# Patient Record
Sex: Female | Born: 1995 | Hispanic: Yes | Marital: Single | Smoking: Never smoker
Health system: Southern US, Community
[De-identification: ages and names within clinical notes are randomized; demographics above are authoritative.]

## PROBLEM LIST (undated history)

## (undated) DIAGNOSIS — G43109 Migraine with aura, not intractable, without status migrainosus: Secondary | ICD-10-CM

## (undated) DIAGNOSIS — R87629 Unspecified abnormal cytological findings in specimens from vagina: Secondary | ICD-10-CM

## (undated) DIAGNOSIS — B009 Herpesviral infection, unspecified: Secondary | ICD-10-CM

## (undated) HISTORY — DX: Unspecified abnormal cytological findings in specimens from vagina: R87.629

## (undated) HISTORY — DX: Herpesviral infection, unspecified: B00.9

## (undated) HISTORY — PX: OTHER SURGICAL HISTORY: SHX169

## (undated) HISTORY — DX: Migraine with aura, not intractable, without status migrainosus: G43.109

---
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
## 2015-10-28 ENCOUNTER — Emergency Department
Admission: EM | Admit: 2015-10-28 | Discharge: 2015-10-29 | Disposition: A | Discharge status: Home / Self Care | Payer: Self-pay | Attending: Emergency Medicine | Admitting: Emergency Medicine

## 2015-10-28 DIAGNOSIS — F10921 Alcohol use, unspecified with intoxication delirium: Secondary | ICD-10-CM | POA: Insufficient documentation

## 2015-10-28 MED ORDER — SODIUM CHLORIDE 0.9 % IV BOLUS (SEPSIS)
1000.0000 mL | Freq: Once | INTRAVENOUS | Status: AC
  Administered 2015-10-28: 1000 mL via INTRAVENOUS

## 2015-10-28 MED ORDER — LORAZEPAM 2 MG/ML IJ SOLN
2.0000 mg | Freq: Once | INTRAMUSCULAR | Status: AC
  Administered 2015-10-28: 2 mg via INTRAMUSCULAR

## 2015-10-28 MED ORDER — HALOPERIDOL LACTATE 5 MG/ML IJ SOLN
5.0000 mg | Freq: Once | INTRAMUSCULAR | Status: AC
  Administered 2015-10-28: 5 mg via INTRAMUSCULAR

## 2015-10-28 NOTE — ED Notes (Signed)
Patient does not respond to questions at this time.  Will await for patient to become more arousable..Marland Kitchen

## 2015-10-28 NOTE — ED Provider Notes (Signed)
[facility] Emergency Department Provider Note  ____________________________________________  Time seen: Approximately 11:02 PM  I have reviewed the triage vital signs and the nursing notes.   HISTORY  Chief Complaint Altered Mental Status  EM caveat: The patient combative agitated, will not answer questions appropriately  HPI Felicia Stanley is a [age] y.o. unknown approximately 4920s-year-old appearing female.  Presents for evaluation from a local "carnival". Apparently thought to be altered, wondering, and felt to be intoxicated and disorderly.  Patient name unknown. No identifying information.    No past medical history on file.  There are no active problems to display for this patient.   No past surgical history on file.  No current outpatient prescriptions on file.  Allergies Review of patient's allergies indicates not on file.  No family history on file.  Social History Social History  Substance Use Topics  . Smoking status: Not on file  . Smokeless tobacco: Not on file  . Alcohol Use: Not on file    Review of Systems EM caveat ____________________________________________   PHYSICAL EXAM:  VITAL SIGNS: ED Triage Vitals  Enc Vitals Group     BP --      Pulse --      Resp --      Temp --      Temp src --      SpO2 --      Weight --      Height --      Head Cir --      Peak Flow --      Pain Score --      Pain Loc --      Pain Edu? --      Excl. in GC? --    Constitutional: Alert But screaming, thrashing and very agitated. She is screaming at times, very disorderly. She is not violent. Eyes: Conjunctivae are slightly injected. PERRL. EOMI. Head: Atraumatic. Nose: No congestion/rhinnorhea. Mouth/Throat: Mucous membranes are moist.  Oropharynx non-erythematous. Neck: No stridor.   Cardiovascular: Normal rate, regular rhythm. Grossly normal heart sounds.  Good peripheral circulation. Respiratory: Slightly increased  respiratory effort, agitated and yelling at times.  No retractions. Lungs CTAB. Gastrointestinal: Soft and nontender. No distention. Musculoskeletal: No lower extremity tenderness nor edema.  No joint effusions. Neurologic:  Agitated. Slightly slurred speech. She does move all extremities well. There is no obvious focal deficit. The patient does have lateral gaze nystagmus. Skin:  Skin is warm, dry and intact. No rash noted. Psychiatric: Mood and affect agitated.  ____________________________________________   LABS (all labs ordered are listed, but only abnormal results are displayed)  Labs Reviewed  CBC  BASIC METABOLIC PANEL  HCG, QUANTITATIVE, PREGNANCY  ETHANOL  URINE DRUG SCREEN, QUALITATIVE ([facility] ONLY)  URINALYSIS COMPLETEWITH MICROSCOPIC ([facility] ONLY)  CBG MONITORING, ED   ____________________________________________  EKG   ____________________________________________  RADIOLOGY CT head pending at time of sign out  ____________________________________________   PROCEDURES  Procedure(s) performed: None  Critical Care performed: No  ____________________________________________   INITIAL IMPRESSION / ASSESSMENT AND PLAN / ED COURSE  Pertinent labs & imaging results that were available during my care of the patient were reviewed by me and considered in my medical decision making (see chart for details).  Patient presents agitated, combative. Some slight slurring her speech and some mild lateral ataxia. Suspect primarily topics, no evidence of trauma.   The patient's labs reviewed, alcohol level 359. This likely explains the patient's hesitation. She had to be administered IM  Haldol and Ativan on arrival. She is now resting comfortably in no distress with stable hemodynamics. Patient is protecting her airway well. There is no report of suicidal ideation or attempt. Plan to observe the patient closely in the ER, continue to monitor on telemetry and pulse oximetry.  Reassess and reevaluate.  Ongoing care and disposition assigned Dr. Dolores Frame. Follow-up CT of the head, hcg, reexamine reevaluate for improvement in mental status. ____________________________________________   FINAL CLINICAL IMPRESSION(S) / ED DIAGNOSES  Final diagnoses:  Alcohol intoxication, with delirium (HCC)      Sharyn Creamer, MD 10/29/15 [phone]

## 2015-10-28 NOTE — ED Notes (Signed)
Dr. Fanny BienQuale notified of critical lab value - ETOH  359.  Acknowledged, no new orders.

## 2015-10-28 NOTE — ED Notes (Signed)
Patient brought to the ED via EMS from local carnival.  Per EMS patient was altered, possibly intoxicated and being disorderly to others on site.  On arrival to the ED patient was yelling and moaning and throwing herself side to side on EMS stretcher and then on ED stretcher.  Dr. Fanny BienQuale to bedside.

## 2015-10-29 ENCOUNTER — Emergency Department: Payer: Self-pay

## 2015-10-29 LAB — CBC
HCT: 39.6 % (ref 35.0–47.0)
Hemoglobin: 13.6 g/dL (ref 12.0–16.0)
MCH: 31.5 pg (ref 26.0–34.0)
MCHC: 34.3 g/dL (ref 32.0–36.0)
MCV: 91.8 fL (ref 80.0–100.0)
Platelets: 203 10*3/uL (ref 150–440)
RBC: 4.31 MIL/uL (ref 3.80–5.20)
RDW: 12 % (ref 11.5–14.5)
WBC: 5.6 10*3/uL (ref 3.6–11.0)

## 2015-10-29 LAB — BASIC METABOLIC PANEL
Anion gap: 7 (ref 5–15)
BUN: 7 mg/dL (ref 6–20)
CO2: 26 mmol/L (ref 22–32)
Calcium: 9.3 mg/dL (ref 8.9–10.3)
Chloride: 109 mmol/L (ref 101–111)
Creatinine, Ser: 0.75 mg/dL (ref 0.44–1.00)
Glucose, Bld: 101 mg/dL — ABNORMAL HIGH (ref 65–99)
Potassium: 3.4 mmol/L — ABNORMAL LOW (ref 3.5–5.1)
Sodium: 142 mmol/L (ref 135–145)

## 2015-10-29 LAB — URINALYSIS COMPLETE WITH MICROSCOPIC (ARMC ONLY)
Bacteria, UA: NONE SEEN
Bilirubin Urine: NEGATIVE
Glucose, UA: NEGATIVE mg/dL
Ketones, ur: NEGATIVE mg/dL
Leukocytes, UA: NEGATIVE
Nitrite: NEGATIVE
Protein, ur: NEGATIVE mg/dL
Specific Gravity, Urine: 1.01 (ref 1.005–1.030)
pH: 6 (ref 5.0–8.0)

## 2015-10-29 LAB — URINE DRUG SCREEN, QUALITATIVE (ARMC ONLY)
Amphetamines, Ur Screen: NOT DETECTED
Barbiturates, Ur Screen: NOT DETECTED
Benzodiazepine, Ur Scrn: NOT DETECTED
Cannabinoid 50 Ng, Ur ~~LOC~~: POSITIVE — AB
Cocaine Metabolite,Ur ~~LOC~~: NOT DETECTED
MDMA (Ecstasy)Ur Screen: NOT DETECTED
Methadone Scn, Ur: NOT DETECTED
Opiate, Ur Screen: NOT DETECTED
Phencyclidine (PCP) Ur S: NOT DETECTED
Tricyclic, Ur Screen: NOT DETECTED

## 2015-10-29 LAB — ETHANOL
Alcohol, Ethyl (B): 359 mg/dL (ref <5)
Alcohol, Ethyl (B): 147 mg/dL — ABNORMAL HIGH (ref <5)

## 2015-10-29 LAB — HCG, QUANTITATIVE, PREGNANCY: hCG, Beta Chain, Quant, S: <1 m[IU]/mL (ref <5)

## 2015-10-29 NOTE — ED Notes (Signed)
Patient was aroused by verbal stimuli, but would fall back asleep easily.  After several attempts patient Stated her name was Felicia Stanley, DOB 23-Oct-1995.

## 2015-10-29 NOTE — ED Notes (Signed)
Patient resting quietly at this time.  No acute distress noted. 

## 2015-10-29 NOTE — ED Notes (Signed)
Patient transported to CT 

## 2015-10-29 NOTE — ED Provider Notes (Signed)
-----------------------------------------   6:52 AM on 10/29/2015 -----------------------------------------   Blood pressure 97/48, pulse 74, resp. rate 17, height 5\' 6"  (1.676 m), weight 150 lb (68.04 kg), SpO2 97 %.  The patient had no acute events since last update.  CT head without contrast interpreted per Dr. Clovis RileyMitchell demonstrated a normal brain. Beta hCG negative. Patient has periods of arousability but falls promptly back to sleep. Second liter IV fluids infusing. She will require reexamination regarding SI/HI/AH/VH once she is awake and sober. If she denies the above, then I anticipate she will be able to be discharged once she is ambulatory with steady gait. Care transferred to Dr. Ladona Ridgelaylor.   Irean HongJade J Sung, MD 10/29/15 [phone]

## 2015-10-29 NOTE — ED Provider Notes (Signed)
Progress note  4./16/17 12:00 PM Patient's alcohol is less than 150, she is alert, oriented, and she is not suicidal or homicidal. Patient was not trying to hurt herself or anyone else, she strictly just gotten intoxicated. Patient was told to return immediately if condition worsens, to not drink alcohol, and follow-up with her PMD as needed.   Leona CarryLinda M Taylor, MD 10/29/15 1200

## 2015-10-29 NOTE — ED Notes (Signed)
Patient bed linens changed x2, patient had wet the bed

## 2015-10-29 NOTE — ED Notes (Signed)
Returned from CT.

## 2016-07-23 DIAGNOSIS — E663 Overweight: Secondary | ICD-10-CM | POA: Insufficient documentation

## 2016-12-02 DIAGNOSIS — B009 Herpesviral infection, unspecified: Secondary | ICD-10-CM | POA: Insufficient documentation

## 2018-03-09 LAB — HM PAP SMEAR: HM Pap smear: NEGATIVE

## 2018-03-09 LAB — HM HIV SCREENING LAB: HM HIV Screening: NEGATIVE

## 2019-04-06 ENCOUNTER — Telehealth: Payer: Self-pay

## 2019-04-13 NOTE — Telephone Encounter (Signed)
TC with patient.  Discussed need for pap f/u. Appt scheduled for 04/19/19. Patient also requests IUD ck and std screen. Aileen Fass, RN

## 2019-04-15 DIAGNOSIS — B009 Herpesviral infection, unspecified: Secondary | ICD-10-CM

## 2019-04-15 DIAGNOSIS — E663 Overweight: Secondary | ICD-10-CM

## 2019-04-19 ENCOUNTER — Ambulatory Visit: Payer: Self-pay

## 2019-04-23 ENCOUNTER — Ambulatory Visit (LOCAL_COMMUNITY_HEALTH_CENTER): Payer: Self-pay | Admitting: Advanced Practice Midwife

## 2019-04-23 ENCOUNTER — Encounter: Payer: Self-pay | Admitting: Advanced Practice Midwife

## 2019-04-23 ENCOUNTER — Other Ambulatory Visit: Payer: Self-pay

## 2019-04-23 ENCOUNTER — Ambulatory Visit: Payer: Self-pay

## 2019-04-23 VITALS — BP 99/61 | Ht 61.0 in | Wt 133.2 lb

## 2019-04-23 DIAGNOSIS — Z3009 Encounter for other general counseling and advice on contraception: Secondary | ICD-10-CM

## 2019-04-23 DIAGNOSIS — Z8742 Personal history of other diseases of the female genital tract: Secondary | ICD-10-CM

## 2019-04-23 DIAGNOSIS — Z3049 Encounter for surveillance of other contraceptives: Secondary | ICD-10-CM

## 2019-04-23 DIAGNOSIS — N760 Acute vaginitis: Secondary | ICD-10-CM

## 2019-04-23 DIAGNOSIS — B9689 Other specified bacterial agents as the cause of diseases classified elsewhere: Secondary | ICD-10-CM

## 2019-04-23 LAB — WET PREP FOR TRICH, YEAST, CLUE
Trichomonas Exam: NEGATIVE
Yeast Exam: NEGATIVE

## 2019-04-23 MED ORDER — METRONIDAZOLE 500 MG PO TABS: 500.0000 mg | ORAL_TABLET | Freq: Two times a day (BID) | ORAL | 0 refills | Status: AC

## 2019-04-23 NOTE — Progress Notes (Signed)
   Felicia Stanley problem visit  [place] Department  Subjective:  Felicia Stanley is a 23 y.o.SHF G2P1 nonsmoker being seen today for STD exam, pap, IUD string check  Chief Complaint  Patient presents with  . Contraception    IUD check  . Exposure to STD  . Abnormal Pap Smear    follow up for 2018 abnormal pap    HPI  LMP 03/24/19, Last sex 04/17/19 without condom.  Paraguard inserted 08/13/2016.  Pap 07/23/16 LGSIL, 03/09/2018 neg.  Denies symptoms Does the patient have a current or past history of drug use? No   No components found for: HCV]   Health Maintenance Due  Topic Date Due  . TETANUS/TDAP  03/01/2013  . INFLUENZA VACCINE  02/13/2019  . PAP SMEAR-Modifier  03/10/2019    ROS  The following portions of the patient's history were reviewed and updated as appropriate: allergies, current medications, past family history, past medical history, past social history, past surgical history and problem list. Problem list updated.   See flowsheet for other program required questions.  Objective:   Vitals:   04/23/19 0849  BP: 99/61  Weight: 133 lb 3.2 oz (60.4 kg)  Height: 5\' 1"  (1.549 m)    Physical Exam Constitutional:      Appearance: Normal appearance. She is normal weight.  HENT:     Head: Normocephalic and atraumatic.  Eyes:     Conjunctiva/sclera: Conjunctivae normal.  Pulmonary:     Effort: Pulmonary effort is normal.     Breath sounds: Normal breath sounds.  Genitourinary:    General: Normal vulva.     Exam position: Lithotomy position.     Pubic Area: No rash or pubic lice.      Labia:        Right: Lesion (anterior aspect of right labia minora is a firm cystic 5x4 mm mass which is sl brown nontender onset 2 mo) present.      Vagina: Vaginal discharge (small amt white malodorous d/c, ph>4.5) present.     Cervix: No friability, lesion or erythema.     Uterus: Normal.      Adnexa: Right adnexa normal and left adnexa normal.   Rectum: Normal.    Skin:    General: Skin is warm and dry.  Neurological:     Mental Status: She is alert.       Assessment and Plan:  Felicia Stanley is a 23 y.o. female presenting to the [facility] Department for a Women's Health problem visit  1. Family planning Treat wet mount for BV Immunization nurse consult Cystic mass right labia minora 4x5 mm--to try warm bath/compresses BID x 10-20.  F/U in 4 wks for reevaluation--please schedule - WET PREP FOR TRICH, YEAST, CLUE - Chlamydia/Gonorrhea [place] - IGP, rfx Aptima HPV ASCU  2. Encounter for surveillance of other contraceptive Here for Paraguard IUD string check  3. Hx of abnormal cervical Pap smear 07/23/16 LGSIL LGSIL 07/23/16, 03/09/18 neg.  Pap done today     Return if symptoms worsen or fail to improve.  No future appointments.  Felicia Stanley, CNM

## 2019-04-23 NOTE — Progress Notes (Signed)
Patient here for follow-up Pap test. Abnormal LGSIL in 2018, and NIL in 2019. Also wants IUD check (paraguard), and STD screening.Jenetta Downer, RN

## 2019-04-23 NOTE — Progress Notes (Signed)
Wet mount reviewed, patient treated for BV per SO..Katie Brewer-Jensen, RN  

## 2019-04-29 LAB — IGP, RFX APTIMA HPV ASCU: PAP Smear Comment: 0

## 2019-06-01 ENCOUNTER — Other Ambulatory Visit: Payer: Self-pay

## 2019-06-01 ENCOUNTER — Ambulatory Visit: Payer: Self-pay | Admitting: Family Medicine

## 2019-06-01 ENCOUNTER — Encounter: Payer: Self-pay | Admitting: Family Medicine

## 2019-06-01 DIAGNOSIS — Z202 Contact with and (suspected) exposure to infections with a predominantly sexual mode of transmission: Secondary | ICD-10-CM

## 2019-06-01 DIAGNOSIS — Z113 Encounter for screening for infections with a predominantly sexual mode of transmission: Secondary | ICD-10-CM

## 2019-06-01 MED ORDER — AZITHROMYCIN 500 MG PO TABS
1000.0000 mg | ORAL_TABLET | Freq: Once | ORAL | Status: AC
  Administered 2019-06-01: 1000 mg via ORAL

## 2019-06-01 NOTE — Progress Notes (Signed)
    STI clinic/screening visit  Subjective:  Felicia Stanley is a 23 y.o. female being seen today for an STI screening visit. The patient reports they do not have symptoms.  Patient has the following medical conditions:   Patient Active Problem List   Diagnosis Date Noted  . Hx of abnormal cervical Pap smear 07/23/16 LGSIL 04/23/2019  . 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 Overweight 07/23/2016     No chief complaint on file.   HPI  Patient reports her partner informed her that she needed treatment for chlamydia.  She states that she was seen @[facility]  3-4 wks ago with negative testing.  See flowsheet for further details and programmatic requirements.    The following portions of the patient's history were reviewed and updated as appropriate: allergies, current medications, past medical history, past social history, past surgical history and problem list.  Objective:  There were no vitals filed for this visit.  Physical Exam Constitutional:      Appearance: Normal appearance.  HENT:     Mouth/Throat:     Pharynx: Oropharynx is clear. No oropharyngeal exudate or posterior oropharyngeal erythema.  Neck:     Musculoskeletal: Neck supple. No muscular tenderness.  Genitourinary:    Comments: Client declines pelvic.  Self-collect GC/chlamydia Lymphadenopathy:     Cervical: No cervical adenopathy.  Skin:    General: Skin is warm and dry.     Findings: No lesion or rash.  Neurological:     Mental Status: She is alert.    Assessment and Plan:  Felicia Stanley is a 23 y.o. female presenting to the [facility] Department for STI screening  1. Screening examination for venereal disease  - Chlamydia/Gonorrhea [facility]  2. Chlamydia contact - azithromycin (ZITHROMAX) tablet 1,000 mg once. -Co to use condoms always.     No follow-ups on file.  No future appointments.  Hassell Done, FNP

## 2019-06-01 NOTE — Progress Notes (Signed)
Patient retreated for Chlamydia per provider orders. Hal Morales, RN

## 2019-10-18 ENCOUNTER — Ambulatory Visit: Payer: Self-pay | Attending: Internal Medicine

## 2019-10-18 ENCOUNTER — Ambulatory Visit: Payer: Self-pay

## 2019-10-18 DIAGNOSIS — Z23 Encounter for immunization: Secondary | ICD-10-CM

## 2019-10-18 NOTE — Progress Notes (Signed)
   Covid-19 Vaccination Clinic  Name:  Felicia Stanley    MRN: [id number] DOB: Jun 05, 1996  10/18/2019  Felicia Stanley was observed post Covid-19 immunization for 15 minutes without incident. She was provided with Vaccine Information Sheet and instruction to access the V-Safe system.   Felicia Stanley was instructed to call 911 with any severe reactions post vaccine: Marland Kitchen Difficulty breathing  . Swelling of face and throat  . A fast heartbeat  . A bad rash all over body  . Dizziness and weakness   Immunizations Administered    Name Date Dose VIS Date Route   Pfizer COVID-19 Vaccine 10/18/2019  1:36 PM 0.3 mL 06/25/2019 Intramuscular   Manufacturer: [employer], [employer]   Lot: [id number]   NDC: [address]-1

## 2019-11-08 ENCOUNTER — Ambulatory Visit: Payer: Self-pay | Attending: Internal Medicine

## 2019-11-08 ENCOUNTER — Ambulatory Visit: Payer: Self-pay

## 2019-11-08 DIAGNOSIS — Z23 Encounter for immunization: Secondary | ICD-10-CM

## 2019-11-08 NOTE — Progress Notes (Signed)
   Covid-19 Vaccination Clinic  Name:  Lujain Kraszewski    MRN: [id number] DOB: [birth year]  11/08/2019  Ms. Thiemann was observed post Covid-19 immunization for 15 minutes without incident. She was provided with Vaccine Information Sheet and instruction to access the V-Safe system.   Ms. Westergard was instructed to call 911 with any severe reactions post vaccine: Marland Kitchen Difficulty breathing  . Swelling of face and throat  . A fast heartbeat  . A bad rash all over body  . Dizziness and weakness   Immunizations Administered    Name Date Dose VIS Date Route   Pfizer COVID-19 Vaccine 11/08/2019  2:26 PM 0.3 mL 09/08/2018 Intramuscular   Manufacturer: [employer], [employer]   Lot: [id number]   NDC: [address]-7

## 2020-10-26 ENCOUNTER — Telehealth: Payer: Self-pay | Admitting: Family Medicine

## 2020-10-26 NOTE — Telephone Encounter (Signed)
Patient called to schedule an appointment for an IUD removal

## 2020-10-30 NOTE — Telephone Encounter (Signed)
Call to client who desires physical and removal of Paragard. Reports last intercourse was 3 weeks ago. Counseled to remain abstinent until after appt on 11/02/2020 and client agrees to do so. Jossie Ng, RN

## 2020-11-02 ENCOUNTER — Ambulatory Visit (LOCAL_COMMUNITY_HEALTH_CENTER): Payer: Self-pay | Admitting: Physician Assistant

## 2020-11-02 ENCOUNTER — Other Ambulatory Visit: Payer: Self-pay

## 2020-11-02 ENCOUNTER — Encounter: Payer: Self-pay | Admitting: Physician Assistant

## 2020-11-02 VITALS — BP 92/59 | Ht 62.0 in | Wt 132.8 lb

## 2020-11-02 DIAGNOSIS — Z3009 Encounter for other general counseling and advice on contraception: Secondary | ICD-10-CM

## 2020-11-02 DIAGNOSIS — Z01419 Encounter for gynecological examination (general) (routine) without abnormal findings: Secondary | ICD-10-CM

## 2020-11-02 NOTE — Progress Notes (Signed)
Patient here for PE and IUD removal. States she has copper IUD and has had it for about 8 years. Patient states she was told she had a labial cyst a couple of years ago and was told to watch it for changes. Would like to have that checked today as well. Declines BCM today to "let my body rest". Cpndom consent signed.Burt Knack, RN

## 2020-11-02 NOTE — Progress Notes (Signed)
[facility] DEPARTMENT [facility] [address] Main Number: [phone]    Family Planning Visit- Initial Visit  Subjective:  Felicia Stanley is a 25 y.o.  G1P0101   being seen today for an initial annual visit and to discuss contraceptive options.  The patient is currently using IUD Paragard for pregnancy prevention. Patient reports she does not want a pregnancy in the next year.  Patient has the following medical conditions has HSV-2 infection; Overweight; and Hx of abnormal cervical Pap smear 07/23/16 LGSIL on their problem list.  Chief Complaint  Patient presents with  . Annual Exam     Body mass index is 24.29 kg/m. - Patient is eligible for diabetes screening based on BMI and age >41?  no HA1C ordered? not applicable  Patient reports 1  partner/s in last year. Desires STI screening?  No - declines  Has patient been screened once for HCV in the past?  No  No results found for: HCVAB  Does the patient have current drug use (including MJ), have a partner with drug use, and/or has been incarcerated since last result? No  If yes-- Screen for HCV through [facility]   Does the patient meet criteria for HBV testing? No  Criteria:  -Household, sexual or needle sharing contact with HBV -History of drug use -HIV positive -Those with known Hep C   Health Maintenance Due  Topic Date Due  . Hepatitis C Screening  Never done  . HPV VACCINES (1 - 2-dose series) Never done  . TETANUS/TDAP  12/06/2015  . PAP SMEAR-Modifier  04/22/2020  . COVID-19 Vaccine (3 - Booster for Pfizer series) 05/09/2020    Review of Systems  All other systems reviewed and are negative.   The following portions of the patient's history were reviewed and updated as appropriate: allergies, current medications, past family history, past medical history, past social history, past surgical history and problem list. Problem list updated.   See flowsheet for other  program required questions.  Objective:   Vitals:   11/02/20 0940  BP: (!) 92/59  Weight: 132 lb 12.8 oz (60.2 kg)  Height: 5\' 2"  (1.575 m)    Physical Exam Constitutional:      Appearance: Normal appearance.  HENT:     Head: Normocephalic and atraumatic.  Pulmonary:     Effort: Pulmonary effort is normal.  Abdominal:     Palpations: Abdomen is soft.     Hernia: There is no hernia in the left inguinal area or right inguinal area.  Genitourinary:    General: Normal vulva.     Exam position: Lithotomy position.     Pubic Area: No rash.      Labia:        Right: Lesion present. No tenderness.        Left: Lesion present. No tenderness.      Urethra: No urethral swelling or urethral lesion.     Vagina: No signs of injury. No vaginal discharge, tenderness or bleeding.     Cervix: No cervical motion tenderness, discharge or lesion.     Uterus: Not tender.      Adnexa:        Right: No tenderness.         Left: No tenderness.       Rectum: No external hemorrhoid.       Comments: IUD strings visible from os Musculoskeletal:        General: Normal range of motion.  Lymphadenopathy:     Lower Body: No right inguinal adenopathy. No left inguinal adenopathy.  Skin:    General: Skin is warm and dry.     Comments: Multiple tattoos noted.  Neurological:     General: No focal deficit present.     Mental Status: She is alert.  Psychiatric:        Mood and Affect: Mood normal.        Behavior: Behavior normal.       Assessment and Plan:  SABRYNA LAHM is a 25 y.o. female presenting to the [facility] Department for an initial annual wellness/contraceptive visit  Contraception counseling: Reviewed all forms of birth control options in the tiered based approach. available including abstinence; over the counter/barrier methods; hormonal contraceptive medication including pill, patch, ring, injection,contraceptive implant, ECP; hormonal and nonhormonal IUDs;  permanent sterilization options including vasectomy and the various tubal sterilization modalities. Risks, benefits, and typical effectiveness rates were reviewed.  Questions were answered.  Written information was also given to the patient to review.  Patient desires condoms, this was prescribed for patient. She will follow up in  12 mo for surveillance.  She was told to call with any further questions, or with any concerns about this method of contraception.  Emphasized use of condoms 100% of the time for STI prevention.  Patient was not offered ECP based on IUD in place.    IUD Removal  Patient identified, informed consent performed, consent signed.  Patient was in the dorsal lithotomy position, normal external genitalia was noted.  A speculum was placed in the patient's vagina, normal discharge was noted, no lesions. The cervix was visualized, no lesions, no abnormal discharge.  The strings of the IUD were grasped and pulled using ring forceps. The IUD was removed in its entirety.   Patient tolerated the procedure well.    Patient will use condoms for contraception/does not have plans for pregnancy soon and she was told to avoid teratogens, take PNV and folic acid.  Routine preventative health maintenance measures emphasized.  1. Family planning services IUD removed at pt request, no complications. Pt given condoms per her request.  2. Well woman exam with routine gynecological exam Pt given reassurance re: unchanging cystic lesions on labia. Enc daily MVI with folic acid. Next Pap due 2023. Enc to obtain PCP.    Return in about 1 year (around 11/02/2021) for Annual well-woman exam.  No future appointments.  Landry Dyke, PA-C
